# Patient Record
Sex: Female | Born: 1995 | Race: Black or African American | Hispanic: No | Marital: Single | Smoking: Never smoker
Health system: Southern US, Community
[De-identification: ages and names within clinical notes are randomized; demographics above are authoritative.]

## PROBLEM LIST (undated history)

## (undated) DIAGNOSIS — G43909 Migraine, unspecified, not intractable, without status migrainosus: Secondary | ICD-10-CM

## (undated) DIAGNOSIS — J45909 Unspecified asthma, uncomplicated: Secondary | ICD-10-CM

---
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
## 2017-01-13 ENCOUNTER — Emergency Department (HOSPITAL_BASED_OUTPATIENT_CLINIC_OR_DEPARTMENT_OTHER)
Admission: EM | Admit: 2017-01-13 | Discharge: 2017-01-13 | Disposition: A | Discharge status: Home / Self Care | Payer: Medicaid Other | Attending: Emergency Medicine | Admitting: Emergency Medicine

## 2017-01-13 ENCOUNTER — Encounter (HOSPITAL_BASED_OUTPATIENT_CLINIC_OR_DEPARTMENT_OTHER): Payer: Self-pay | Admitting: *Deleted

## 2017-01-13 ENCOUNTER — Other Ambulatory Visit: Payer: Self-pay

## 2017-01-13 DIAGNOSIS — Z79899 Other long term (current) drug therapy: Secondary | ICD-10-CM | POA: Diagnosis not present

## 2017-01-13 DIAGNOSIS — J020 Streptococcal pharyngitis: Secondary | ICD-10-CM | POA: Diagnosis not present

## 2017-01-13 DIAGNOSIS — J45909 Unspecified asthma, uncomplicated: Secondary | ICD-10-CM | POA: Diagnosis not present

## 2017-01-13 DIAGNOSIS — J029 Acute pharyngitis, unspecified: Secondary | ICD-10-CM | POA: Diagnosis present

## 2017-01-13 LAB — RAPID STREP SCREEN (MED CTR MEBANE ONLY): STREPTOCOCCUS, GROUP A SCREEN (DIRECT): NEGATIVE

## 2017-01-13 MED ORDER — AMOXICILLIN 500 MG PO CAPS: 500.0000 mg | ORAL_CAPSULE | Freq: Two times a day (BID) | ORAL | 0 refills | Status: AC

## 2017-01-13 MED FILL — AMOXICILLIN 500 MG CAPSULE: 500 | 7 days supply | Qty: 14 | Fill #0

## 2017-01-13 NOTE — Discharge Instructions (Signed)
You can take Tylenol or Ibuprofen as directed for pain. You can alternate Tylenol and Ibuprofen every 4 hours. If you take Tylenol at 1pm, then you can take Ibuprofen at 5pm. Then you can take Tylenol again at 9pm.   Take antibiotics as directed. Please take all of your antibiotics until finished.  Make sure you are drinking and staying hydrated.   Return to the Emergency Department for any worsening pain, fever, difficulty swallowing, difficulty breathing, or any other worsening or concerning symptoms.

## 2017-01-13 NOTE — ED Provider Notes (Signed)
[facility] EMERGENCY DEPARTMENT Provider Note   CSN: [id number] Arrival date & time: 01/13/17  1248     History   Chief Complaint Chief Complaint  Patient presents with  . Sore Throat    HPI Sarah Buckley is a 21 y.o. female who sent for evaluation of sore throat times 2 days.  Patient reports that she is not taking her medications for the symptoms.  The patient reports that she is able to swallow but reports worsening pain with swallowing.  Patient states that she is able to tolerate her secretions.  Has not had any fever, cough, difficulty breathing, wheezing, chest pain.  The history is provided by the patient.    Past Medical History:  Diagnosis Date  . Asthma   . Migraine     There are no active problems to display for this patient.   History reviewed. No pertinent surgical history.  OB History    No data available       Home Medications    Prior to Admission medications   Medication Sig Start Date End Date Taking? Authorizing Provider  ALBUTEROL IN Inhale into the lungs.   Yes [provider]  amoxicillin (AMOXIL) 500 MG capsule Take 1 capsule (500 mg total) by mouth 2 (two) times daily for 7 days. 01/13/17 01/20/17  Maxwell CaulLayden, Lindsey A, PA-C  esomeprazole (NEXIUM) 40 MG capsule Take 1 capsule (40 mg total) by mouth daily. 12/06/16 12/20/16  Caccavale, Sophia, PA-C  ipratropium (ATROVENT) 0.06 % nasal spray Place 2 sprays into both nostrils 3 (three) times daily. 06/21/13   Leona Singletonhekkekandam, Maria T, MD  ondansetron (ZOFRAN) 4 MG tablet Take 1 tablet (4 mg total) by mouth every 8 (eight) hours as needed for nausea or vomiting. 12/06/16   Caccavale, Sophia, PA-C    Family History No family history on file.  Social History Social History   Tobacco Use  . Smoking status: Never Smoker  . Smokeless tobacco: Never Used  Substance Use Topics  . Alcohol use: No  . Drug use: No     Allergies   Patient has no known allergies.   Review of  Systems Review of Systems  Constitutional: Negative for fever.  HENT: Positive for sore throat. Negative for drooling and trouble swallowing.   Respiratory: Negative for cough.      Physical Exam Updated Vital Signs BP 96/64   Pulse 81   Temp 98.5 F (36.9 C) (Oral)   Resp 18   Ht 4\' 11"  (1.499 m)   Wt 46.7 kg (103 lb)   LMP 12/23/2016   SpO2 100%   BMI 20.80 kg/m   Physical Exam  Constitutional: She appears well-developed and well-nourished.  HENT:  Head: Normocephalic and atraumatic.  Mouth/Throat: Uvula is midline. No trismus in the jaw. Oropharyngeal exudate, posterior oropharyngeal edema and posterior oropharyngeal erythema present. Tonsillar exudate.  Posterior oropharynx is erythematous, edematous with evidence of exudates.  Bilateral tonsils have exudates.  No evidence of peritonsillar abscess.  Uvula is midline.  No trismus.  Eyes: Conjunctivae and EOM are normal. Right eye exhibits no discharge. Left eye exhibits no discharge. No scleral icterus.  Pulmonary/Chest: Effort normal.  No evidence of respiratory distress. Able to speak in full sentences without difficulty.  Neurological: She is alert.  Skin: Skin is warm and dry.  Psychiatric: She has a normal mood and affect. Her speech is normal and behavior is normal.  Nursing note and vitals reviewed.    ED Treatments / Results  Labs (all labs ordered are listed, but only abnormal results are displayed) Labs Reviewed  RAPID STREP SCREEN (NOT AT [facility])  CULTURE, GROUP A STREP [facility])    EKG  EKG Interpretation None       Radiology No results found.  Procedures Procedures (including critical care time)  Medications Ordered in ED Medications - No data to display   Initial Impression / Assessment and Plan / ED Course  I have reviewed the triage vital signs and the nursing notes.  Pertinent labs & imaging results that were available during my care of the patient were reviewed by me and considered in  my medical decision making (see chart for details).     21 year old female who presents today for evaluation of 2 days of sore throat.  Has not been taking any medications for the pain.  Denies any fever, cough, chest pain, difficulty breathing.  She is able to tolerate her secretions. Patient is afebrile, non-toxic appearing, sitting comfortably on examination table. Vital signs reviewed and stable.  Exam shows posterior oropharynx is edematous, erythematous with presence of exudates on bilateral tonsils.  Consider pharyngitis versus URI versus postnasal drip.  History/physical exam and on concerning for peritonsillar abscess or Ludwig angina.  Rapid strep ordered at triage.  Rapid strep is negative.  Findings on physical exam, we will plan to treat.  Patient with no known drug allergies.  Conservative therapies discussed. Patient had ample opportunity for questions and discussion. All patient's questions were answered with full understanding. Strict return precautions discussed. Patient expresses understanding and agreement to plan.    Final Clinical Impressions(s) / ED Diagnoses   Final diagnoses:  Strep pharyngitis    ED Discharge Orders        Ordered    amoxicillin (AMOXIL) 500 MG capsule  2 times daily     01/13/17 1334       Maxwell CaulLayden, Lindsey A, PA-C 01/13/17 1642    Melene PlanFloyd, Dan, DO 01/14/17 0710

## 2017-01-13 NOTE — ED Triage Notes (Signed)
Sore throat x 2 days

## 2017-01-16 LAB — CULTURE, GROUP A STREP (THRC)

## 2017-08-10 ENCOUNTER — Encounter (HOSPITAL_BASED_OUTPATIENT_CLINIC_OR_DEPARTMENT_OTHER): Payer: Self-pay | Admitting: *Deleted

## 2017-08-10 ENCOUNTER — Emergency Department (HOSPITAL_BASED_OUTPATIENT_CLINIC_OR_DEPARTMENT_OTHER)
Admission: EM | Admit: 2017-08-10 | Discharge: 2017-08-10 | Disposition: A | Discharge status: Home / Self Care | Payer: Medicaid Other | Attending: Emergency Medicine | Admitting: Emergency Medicine

## 2017-08-10 ENCOUNTER — Other Ambulatory Visit: Payer: Self-pay

## 2017-08-10 DIAGNOSIS — Z79899 Other long term (current) drug therapy: Secondary | ICD-10-CM | POA: Diagnosis not present

## 2017-08-10 DIAGNOSIS — R319 Hematuria, unspecified: Secondary | ICD-10-CM | POA: Diagnosis present

## 2017-08-10 DIAGNOSIS — N3091 Cystitis, unspecified with hematuria: Secondary | ICD-10-CM | POA: Insufficient documentation

## 2017-08-10 DIAGNOSIS — N3090 Cystitis, unspecified without hematuria: Secondary | ICD-10-CM

## 2017-08-10 DIAGNOSIS — J45909 Unspecified asthma, uncomplicated: Secondary | ICD-10-CM | POA: Insufficient documentation

## 2017-08-10 LAB — PREGNANCY, URINE: Preg Test, Ur: NEGATIVE

## 2017-08-10 LAB — URINALYSIS, ROUTINE W REFLEX MICROSCOPIC

## 2017-08-10 LAB — URINALYSIS, MICROSCOPIC (REFLEX): RBC / HPF: >50 RBC/hpf (ref 0–5)

## 2017-08-10 MED ORDER — IBUPROFEN 400 MG PO TABS
600.0000 mg | ORAL_TABLET | Freq: Once | ORAL | Status: AC
  Administered 2017-08-10: 600 mg via ORAL
  Filled 2017-08-10: qty 1

## 2017-08-10 MED ORDER — OXYCODONE-ACETAMINOPHEN 5-325 MG PO TABS
1.0000 | ORAL_TABLET | Freq: Once | ORAL | Status: AC
  Administered 2017-08-10: 1 via ORAL
  Filled 2017-08-10: qty 1

## 2017-08-10 MED ORDER — PHENAZOPYRIDINE HCL 200 MG PO TABS: 200.0000 mg | ORAL_TABLET | Freq: Three times a day (TID) | ORAL | 0 refills | Status: DC

## 2017-08-10 MED ORDER — SULFAMETHOXAZOLE-TRIMETHOPRIM 800-160 MG PO TABS
1.0000 | ORAL_TABLET | Freq: Once | ORAL | Status: AC
  Administered 2017-08-10: 1 via ORAL
  Filled 2017-08-10: qty 1

## 2017-08-10 MED ORDER — SULFAMETHOXAZOLE-TRIMETHOPRIM 800-160 MG PO TABS: 1.0000 | ORAL_TABLET | Freq: Two times a day (BID) | ORAL | 0 refills | Status: AC

## 2017-08-10 NOTE — ED Triage Notes (Addendum)
Vaginal bleeding x 2 hours. States she is on Depo Injections. Last injection was 6 weeks ago. States she only can see blood after she urinates. Unsure if the bleeding is in her urine or coming from her vagina.

## 2017-08-12 LAB — URINE CULTURE: Culture: 60000 — AB

## 2017-08-13 ENCOUNTER — Telehealth: Payer: Self-pay | Admitting: *Deleted

## 2017-08-13 NOTE — Telephone Encounter (Signed)
Post ED Visit - Positive Culture Follow-up  Culture report reviewed by antimicrobial stewardship pharmacist:  []  Enzo BiNathan Batchelder, Pharm.D. []  Celedonio MiyamotoJeremy Frens, Pharm.D., BCPS AQ-ID []  Garvin FilaMike Maccia, Pharm.D., BCPS []  Georgina PillionElizabeth Martin, [address] BCPS []  HooverMinh Pham, [address]Ph[address]D., BCPS, AAHIVP []  Estella HuskMichelle Turner, Pharm.D., BCPS, AAHIVP []  Lysle Pearlachel Rumbarger, PharmD, BCPS []  Phillips Climeshuy Dang, PharmD, BCPS []  Agapito GamesAlison Masters, PharmD, BCPS []  Verlan FriendsErin Deja, PharmD Pola CornBen Mancheril, PharmD  Positive urine culture Treated with Sulfamethoxazole-Trimethoprim, organism sensitive to the same and no further patient follow-up is required at this time.  Virl AxeRobertson, Morgen Linebaugh [facility] 08/13/2017, 10:39 AM

## 2017-08-17 NOTE — ED Provider Notes (Signed)
[facility] EMERGENCY DEPARTMENT Provider Note   CSN: [id number] Arrival date & time: 08/10/17  2137     History   Chief Complaint Chief Complaint  Patient presents with  . Hematuria    HPI Sarah Buckley is a 22 y.o. female.  HPI   21yF with abdominal pain and hematuria. Onset earlier today. Noticed a lot of blood when she urinated. Some suprapubic pain which is significantly worse when she tries to void. Urgency. No vaginal bleeding or discharge. No fever. Doesn't think she is pregnant.   Past Medical History:  Diagnosis Date  . Asthma   . Migraine     There are no active problems to display for this patient.   History reviewed. No pertinent surgical history.   OB History   None      Home Medications    Prior to Admission medications   Medication Sig Start Date End Date Taking? Authorizing Provider  ALBUTEROL IN Inhale into the lungs.    [provider]  esomeprazole (NEXIUM) 40 MG capsule Take 1 capsule (40 mg total) by mouth daily. 12/06/16 12/20/16  Caccavale, Sophia, PA-C  ipratropium (ATROVENT) 0.06 % nasal spray Place 2 sprays into both nostrils 3 (three) times daily. 06/21/13   Leona Singleton, MD  ondansetron (ZOFRAN) 4 MG tablet Take 1 tablet (4 mg total) by mouth every 8 (eight) hours as needed for nausea or vomiting. 12/06/16   Caccavale, Sophia, PA-C  phenazopyridine (PYRIDIUM) 200 MG tablet Take 1 tablet (200 mg total) by mouth 3 (three) times daily. 08/10/17   Raeford Razor, MD    Family History No family history on file.  Social History Social History   Tobacco Use  . Smoking status: Never Smoker  . Smokeless tobacco: Never Used  Substance Use Topics  . Alcohol use: No  . Drug use: No     Allergies   Patient has no known allergies.   Review of Systems Review of Systems  All systems reviewed and negative, other than as noted in HPI.  Physical Exam Updated Vital Signs BP 114/72   Pulse 97   Temp 98.4 F  (36.9 C) (Oral)   Resp 20   Ht 4\' 11"  (1.499 m)   Wt 47.6 kg (105 lb)   LMP 08/10/2017   SpO2 100%   BMI 21.21 kg/m   Physical Exam  Constitutional: She appears well-developed and well-nourished. No distress.  HENT:  Head: Normocephalic and atraumatic.  Eyes: Conjunctivae are normal. Right eye exhibits no discharge. Left eye exhibits no discharge.  Neck: Neck supple.  Cardiovascular: Normal rate, regular rhythm and normal heart sounds. Exam reveals no gallop and no friction rub.  No murmur heard. Pulmonary/Chest: Effort normal and breath sounds normal. No respiratory distress.  Abdominal: Soft. She exhibits no distension. There is tenderness.  Mild suprapubic tenderness w/o rebound or guarding  Musculoskeletal: She exhibits no edema or tenderness.  Neurological: She is alert.  Skin: Skin is warm and dry.  Psychiatric: She has a normal mood and affect. Her behavior is normal. Thought content normal.  Nursing note and vitals reviewed.    ED Treatments / Results  Labs (all labs ordered are listed, but only abnormal results are displayed) Labs Reviewed  URINE CULTURE - Abnormal; Notable for the following components:      Result Value   Culture   (*)    Value: 60,000 COLONIES/mL GROUP B STREP(S.AGALACTIAE)ISOLATED TESTING AGAINST S. AGALACTIAE NOT ROUTINELY PERFORMED DUE TO PREDICTABILITY OF AMP/PEN/VAN  SUSCEPTIBILITY. Performed at [facility], [address] [place], [place] [id number]    All other components within normal limits  URINALYSIS, ROUTINE W REFLEX MICROSCOPIC - Abnormal; Notable for the following components:   Color, Urine RED (*)    APPearance TURBID (*)    Glucose, UA   (*)    Value: TEST NOT REPORTED DUE TO COLOR INTERFERENCE OF URINE PIGMENT   Hgb urine dipstick   (*)    Value: TEST NOT REPORTED DUE TO COLOR INTERFERENCE OF URINE PIGMENT   Bilirubin Urine   (*)    Value: TEST NOT REPORTED DUE TO COLOR INTERFERENCE OF URINE PIGMENT   Ketones, ur    (*)    Value: TEST NOT REPORTED DUE TO COLOR INTERFERENCE OF URINE PIGMENT   Protein, ur   (*)    Value: TEST NOT REPORTED DUE TO COLOR INTERFERENCE OF URINE PIGMENT   Nitrite   (*)    Value: TEST NOT REPORTED DUE TO COLOR INTERFERENCE OF URINE PIGMENT   Leukocytes, UA   (*)    Value: TEST NOT REPORTED DUE TO COLOR INTERFERENCE OF URINE PIGMENT   All other components within normal limits  URINALYSIS, MICROSCOPIC (REFLEX) - Abnormal; Notable for the following components:   Bacteria, UA FEW (*)    All other components within normal limits  PREGNANCY, URINE    EKG None  Radiology No results found.  Procedures Procedures (including critical care time)  Medications Ordered in ED Medications  ibuprofen (ADVIL,MOTRIN) tablet 600 mg (600 mg Oral Given 08/10/17 2227)  oxyCODONE-acetaminophen (PERCOCET/ROXICET) 5-325 MG per tablet 1 tablet (1 tablet Oral Given 08/10/17 2227)  sulfamethoxazole-trimethoprim (BACTRIM DS,SEPTRA DS) 800-160 MG per tablet 1 tablet (1 tablet Oral Given 08/10/17 2235)     Initial Impression / Assessment and Plan / ED Course  I have reviewed the triage vital signs and the nursing notes.  Pertinent labs & imaging results that were available during my care of the patient were reviewed by me and considered in my medical decision making (see chart for details).     Likely hemorrhagic cystitis. Hard to interpret UA with degree of blood. Will send culture and treat based off symptoms.   Final Clinical Impressions(s) / ED Diagnoses   Final diagnoses:  Cystitis    ED Discharge Orders        Ordered    sulfamethoxazole-trimethoprim (BACTRIM DS,SEPTRA DS) 800-160 MG tablet  2 times daily     08/10/17 2230    phenazopyridine (PYRIDIUM) 200 MG tablet  3 times daily     08/10/17 2230       Raeford RazorKohut, [place], MD 08/17/17 [phone]

## 2018-01-05 ENCOUNTER — Emergency Department (HOSPITAL_BASED_OUTPATIENT_CLINIC_OR_DEPARTMENT_OTHER): Payer: Medicaid Other

## 2018-01-05 ENCOUNTER — Emergency Department (HOSPITAL_BASED_OUTPATIENT_CLINIC_OR_DEPARTMENT_OTHER)
Admission: EM | Admit: 2018-01-05 | Discharge: 2018-01-06 | Disposition: A | Discharge status: Home / Self Care | Payer: Medicaid Other | Attending: Emergency Medicine | Admitting: Emergency Medicine

## 2018-01-05 ENCOUNTER — Other Ambulatory Visit: Payer: Self-pay

## 2018-01-05 ENCOUNTER — Encounter (HOSPITAL_BASED_OUTPATIENT_CLINIC_OR_DEPARTMENT_OTHER): Payer: Self-pay | Admitting: Emergency Medicine

## 2018-01-05 DIAGNOSIS — Y93I9 Activity, other involving external motion: Secondary | ICD-10-CM | POA: Diagnosis not present

## 2018-01-05 DIAGNOSIS — S3992XA Unspecified injury of lower back, initial encounter: Secondary | ICD-10-CM | POA: Diagnosis not present

## 2018-01-05 DIAGNOSIS — Y92410 Unspecified street and highway as the place of occurrence of the external cause: Secondary | ICD-10-CM | POA: Diagnosis not present

## 2018-01-05 DIAGNOSIS — Y998 Other external cause status: Secondary | ICD-10-CM | POA: Insufficient documentation

## 2018-01-05 DIAGNOSIS — S199XXA Unspecified injury of neck, initial encounter: Secondary | ICD-10-CM | POA: Insufficient documentation

## 2018-01-05 DIAGNOSIS — M542 Cervicalgia: Secondary | ICD-10-CM

## 2018-01-05 MED ORDER — ACETAMINOPHEN 500 MG PO TABS
1000.0000 mg | ORAL_TABLET | Freq: Once | ORAL | Status: AC
  Administered 2018-01-06: 1000 mg via ORAL
  Filled 2018-01-05: qty 2
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
## 2018-01-06 MED ORDER — NAPROXEN 375 MG PO TABS: 375.0000 mg | ORAL_TABLET | Freq: Two times a day (BID) | ORAL | 0 refills | Status: DC

## 2018-01-06 MED ORDER — METHOCARBAMOL 500 MG PO TABS: 500.0000 mg | ORAL_TABLET | Freq: Two times a day (BID) | ORAL | 0 refills | Status: DC
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
## 2018-03-08 ENCOUNTER — Emergency Department (HOSPITAL_BASED_OUTPATIENT_CLINIC_OR_DEPARTMENT_OTHER)
Admission: EM | Admit: 2018-03-08 | Discharge: 2018-03-08 | Disposition: A | Discharge status: Home / Self Care | Payer: Medicaid Other | Attending: Emergency Medicine | Admitting: Emergency Medicine

## 2018-03-08 ENCOUNTER — Other Ambulatory Visit: Payer: Self-pay

## 2018-03-08 ENCOUNTER — Encounter (HOSPITAL_BASED_OUTPATIENT_CLINIC_OR_DEPARTMENT_OTHER): Payer: Self-pay | Admitting: *Deleted

## 2018-03-08 DIAGNOSIS — Z79899 Other long term (current) drug therapy: Secondary | ICD-10-CM | POA: Insufficient documentation

## 2018-03-08 DIAGNOSIS — J45909 Unspecified asthma, uncomplicated: Secondary | ICD-10-CM | POA: Diagnosis not present

## 2018-03-08 DIAGNOSIS — Z202 Contact with and (suspected) exposure to infections with a predominantly sexual mode of transmission: Secondary | ICD-10-CM | POA: Diagnosis present

## 2018-03-08 LAB — URINALYSIS, ROUTINE W REFLEX MICROSCOPIC
BILIRUBIN URINE: NEGATIVE
GLUCOSE, UA: NEGATIVE mg/dL
KETONES UR: NEGATIVE mg/dL
NITRITE: NEGATIVE
PH: 6.5 (ref 5.0–8.0)
Protein, ur: NEGATIVE mg/dL
Specific Gravity, Urine: 1.015 (ref 1.005–1.030)

## 2018-03-08 LAB — URINALYSIS, MICROSCOPIC (REFLEX)

## 2018-03-08 LAB — PREGNANCY, URINE: Preg Test, Ur: NEGATIVE

## 2018-03-08 MED ORDER — LIDOCAINE HCL (PF) 1 % IJ SOLN
INTRAMUSCULAR | Status: AC
  Filled 2018-03-08: qty 5

## 2018-03-08 MED ORDER — CEFTRIAXONE SODIUM 250 MG IJ SOLR
250.0000 mg | Freq: Once | INTRAMUSCULAR | Status: AC
  Administered 2018-03-08: 250 mg via INTRAMUSCULAR
  Filled 2018-03-08: qty 250

## 2018-03-08 MED ORDER — AZITHROMYCIN 250 MG PO TABS
1000.0000 mg | ORAL_TABLET | Freq: Once | ORAL | Status: AC
  Administered 2018-03-08: 1000 mg via ORAL
  Filled 2018-03-08: qty 4

## 2018-03-08 NOTE — Discharge Instructions (Addendum)
You were treated for gonorrhea and chlamydia, results should be posted to MyChart by the end of the week.  Follow-up with your gynecologist as needed.

## 2018-03-08 NOTE — ED Triage Notes (Signed)
Her boyfriend cheated on her and he was exposed to chlamydia.

## 2018-03-08 NOTE — ED Provider Notes (Signed)
[facility] EMERGENCY DEPARTMENT Provider Note   CSN: [id number] Arrival date & time: 03/08/18  1702     History   Chief Complaint Chief Complaint  Patient presents with  . Exposure to STD    HPI Sarah Buckley is a 23 y.o. female.  23 y.o female with a PMH of Asthma, Migraine presents to the ED requesting STD testing. Patient reports her boyfriend testing positive for chlamydia and she would like to be treated. Patient states she has no vaginal discharge, vaginal bleeding, urinary symptoms.      Past Medical History:  Diagnosis Date  . Asthma   . Migraine     There are no active problems to display for this patient.   History reviewed. No pertinent surgical history.   OB History   No obstetric history on file.      Home Medications    Prior to Admission medications   Medication Sig Start Date End Date Taking? Authorizing Provider  ALBUTEROL IN Inhale into the lungs.   Yes [provider]  methocarbamol (ROBAXIN) 500 MG tablet Take 1 tablet (500 mg total) by mouth 2 (two) times daily. 01/06/18  Yes [place] Scrape, Alyssa B, PA-C  esomeprazole (NEXIUM) 40 MG capsule Take 1 capsule (40 mg total) by mouth daily. 12/06/16 12/20/16  Caccavale, Sophia, PA-C  ipratropium (ATROVENT) 0.06 % nasal spray Place 2 sprays into both nostrils 3 (three) times daily. 06/21/13   Leona Singleton, MD  naproxen (NAPROSYN) 375 MG tablet Take 1 tablet (375 mg total) by mouth 2 (two) times daily. 01/06/18   Aviva Kluver B, PA-C  ondansetron (ZOFRAN) 4 MG tablet Take 1 tablet (4 mg total) by mouth every 8 (eight) hours as needed for nausea or vomiting. 12/06/16   Caccavale, Sophia, PA-C  phenazopyridine (PYRIDIUM) 200 MG tablet Take 1 tablet (200 mg total) by mouth 3 (three) times daily. 08/10/17   Raeford Razor, MD    Family History No family history on file.  Social History Social History   Tobacco Use  . Smoking status: Never Smoker  . Smokeless tobacco: Never  Used  Substance Use Topics  . Alcohol use: No  . Drug use: No     Allergies   Patient has no known allergies.   Review of Systems Review of Systems  Constitutional: Negative for chills and fever.  HENT: Negative for ear pain and sore throat.   Eyes: Negative for pain and visual disturbance.  Respiratory: Negative for cough and shortness of breath.   Cardiovascular: Negative for chest pain and palpitations.  Gastrointestinal: Negative for abdominal pain and vomiting.  Genitourinary: Negative for dysuria and hematuria.  Musculoskeletal: Negative for arthralgias and back pain.  Skin: Negative for color change and rash.  Neurological: Negative for seizures and syncope.  All other systems reviewed and are negative.    Physical Exam Updated Vital Signs BP 113/72 (BP Location: Left Arm)   Pulse 75   Temp 98.7 F (37.1 C) (Oral)   Resp 20   Ht 4\' 11"  (1.499 m)   Wt 49.9 kg   SpO2 98%   BMI 22.22 kg/m   Physical Exam Vitals signs and nursing note reviewed.  Constitutional:      General: She is not in acute distress.    Appearance: She is well-developed.  HENT:     Head: Normocephalic and atraumatic.     Mouth/Throat:     Pharynx: No oropharyngeal exudate.  Eyes:     Pupils: Pupils are equal,  round, and reactive to light.  Neck:     Musculoskeletal: Normal range of motion.  Cardiovascular:     Rate and Rhythm: Regular rhythm.     Heart sounds: Normal heart sounds.  Pulmonary:     Effort: Pulmonary effort is normal. No respiratory distress.     Breath sounds: Normal breath sounds.  Abdominal:     General: Bowel sounds are normal. There is no distension.     Palpations: Abdomen is soft.     Tenderness: There is no abdominal tenderness.  Musculoskeletal:        General: No tenderness or deformity.     Right lower leg: No edema.     Left lower leg: No edema.  Skin:    General: Skin is warm and dry.  Neurological:     Mental Status: She is alert and oriented to  person, place, and time.      ED Treatments / Results  Labs (all labs ordered are listed, but only abnormal results are displayed) Labs Reviewed  URINALYSIS, ROUTINE W REFLEX MICROSCOPIC - Abnormal; Notable for the following components:      Result Value   Hgb urine dipstick MODERATE (*)    Leukocytes, UA SMALL (*)    All other components within normal limits  URINALYSIS, MICROSCOPIC (REFLEX) - Abnormal; Notable for the following components:   Bacteria, UA RARE (*)    All other components within normal limits  PREGNANCY, URINE  GC/CHLAMYDIA PROBE AMP ([place]) NOT AT [facility]    EKG None  Radiology No results found.  Procedures Procedures (including critical care time)  Medications Ordered in ED Medications  azithromycin (ZITHROMAX) tablet 1,000 mg (has no administration in time range)  cefTRIAXone (ROCEPHIN) injection 250 mg (has no administration in time range)     Initial Impression / Assessment and Plan / ED Course  I have reviewed the triage vital signs and the nursing notes.  Pertinent labs & imaging results that were available during my care of the patient were reviewed by me and considered in my medical decision making (see chart for details).    Shin with STD exposure by boyfriend, reports he was recently tested positive for chlamydia.  She denies any symptoms, self swab was performed as she declined pelvic exam due to no symptoms.  She denies any vaginal discharge, vaginal bleeding, dysuria or other symptoms at this time.  She is requesting treatment for gonorrhea and chlamydia.  At this time I have sent off a GC swab along with provided patient with Zithromax 1 g and Rocephin 250 to treat her STD exposure.  She denies any other symptoms at this time.  Patient is seen at [facility] [address] and reports she has an upcoming appointment for a annual exam and will be requesting further testing.  Denies any concern for herpes, other infections at this time.  Patient stable  during ED visit, patient stable for discharge at this time.  Final Clinical Impressions(s) / ED Diagnoses   Final diagnoses:  STD exposure    ED Discharge Orders    None       Claude Manges, Cordelia Poche 03/08/18 2053    Virgina Norfolk, DO 03/09/18 0100

## 2018-03-09 LAB — GC/CHLAMYDIA PROBE AMP (~~LOC~~) NOT AT ARMC
CHLAMYDIA, DNA PROBE: POSITIVE — AB
NEISSERIA GONORRHEA: NEGATIVE
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
## 2019-11-11 ENCOUNTER — Emergency Department (HOSPITAL_BASED_OUTPATIENT_CLINIC_OR_DEPARTMENT_OTHER)
Admission: EM | Admit: 2019-11-11 | Discharge: 2019-11-11 | Disposition: A | Discharge status: Home / Self Care | Payer: Medicaid Other | Attending: Emergency Medicine | Admitting: Emergency Medicine

## 2019-11-11 ENCOUNTER — Encounter (HOSPITAL_BASED_OUTPATIENT_CLINIC_OR_DEPARTMENT_OTHER): Payer: Self-pay | Admitting: *Deleted

## 2019-11-11 ENCOUNTER — Other Ambulatory Visit: Payer: Self-pay

## 2019-11-11 ENCOUNTER — Emergency Department (HOSPITAL_BASED_OUTPATIENT_CLINIC_OR_DEPARTMENT_OTHER): Payer: Medicaid Other

## 2019-11-11 DIAGNOSIS — S62522A Displaced fracture of distal phalanx of left thumb, initial encounter for closed fracture: Secondary | ICD-10-CM | POA: Insufficient documentation

## 2019-11-11 DIAGNOSIS — X501XXA Overexertion from prolonged static or awkward postures, initial encounter: Secondary | ICD-10-CM | POA: Diagnosis not present

## 2019-11-11 DIAGNOSIS — S6992XA Unspecified injury of left wrist, hand and finger(s), initial encounter: Secondary | ICD-10-CM | POA: Diagnosis present

## 2019-11-11 DIAGNOSIS — Y9301 Activity, walking, marching and hiking: Secondary | ICD-10-CM | POA: Insufficient documentation

## 2019-11-11 DIAGNOSIS — J45909 Unspecified asthma, uncomplicated: Secondary | ICD-10-CM | POA: Insufficient documentation

## 2019-11-11 NOTE — Discharge Instructions (Signed)
At this time there does not appear to be the presence of an emergent medical condition, however there is always the potential for conditions to change. Please read and follow the below instructions.  Please return to the Emergency Department immediately for any new or worsening symptoms. Please be sure to follow up with your Primary Care Provider within one week regarding your visit today; please call their office to schedule an appointment even if you are feeling better for a follow-up visit. Please call the hand specialist tomorrow morning Dr. Izora Ribas on your discharge paperwork for follow-up appointment regarding your thumb fracture.  Please use the thumb spica splint provided today to protect your thumb from further injury.  Use rest ice and elevation to help with pain and swelling.  Go to the nearest Emergency Department immediately if: You have fever or chills Your thumb feels numb, tingles, turns cold, or turns blue. You have redness or swelling that gets worse. You have severe pain. You have any new/concerning or worsening of symptoms  Please read the additional information packets attached to your discharge summary.  Do not take your medicine if  develop an itchy rash, swelling in your mouth or lips, or difficulty breathing; call 911 and seek immediate emergency medical attention if this occurs.  You may review your lab tests and imaging results in their entirety on your MyChart account.  Please discuss all results of fully with your primary care provider and other specialist at your follow-up visit.  Note: Portions of this text may have been transcribed using voice recognition software. Every effort was made to ensure accuracy; however, inadvertent computerized transcription errors may still be present.

## 2019-11-11 NOTE — ED Provider Notes (Signed)
[facility] EMERGENCY DEPARTMENT Provider Note   CSN: [id number] Arrival date & time: 11/11/19  1607     History Chief Complaint  Patient presents with  . Hand Injury    Sarah Buckley is a 24 y.o. female presents today for left thumb pain, patient reports around 3 PM today she was walking her dog when it pulled away from her, the leash was poked on the tip of her left thumb and pulled her thumb backwards towards her wrist.  She reports pain at the interphalangeal joint of the left thumb a moderate intensity throbbing pain constant worsened with movement at the joint improved with rest and time, pain does not radiate.  She denies fall, head injury, neck pain, back pain, chest pain, abdominal pain, numbness/tingling, weakness, skin break or any additional concerns.  HPI     Past Medical History:  Diagnosis Date  . Asthma   . Migraine     There are no problems to display for this patient.   History reviewed. No pertinent surgical history.   OB History   No obstetric history on file.     No family history on file.  Social History   Tobacco Use  . Smoking status: Never Smoker  . Smokeless tobacco: Never Used  Substance Use Topics  . Alcohol use: No  . Drug use: No    Home Medications Prior to Admission medications   Medication Sig Start Date End Date Taking? Authorizing Provider  ALBUTEROL IN Inhale into the lungs.   Yes [provider]  ipratropium (ATROVENT) 0.06 % nasal spray Place 2 sprays into both nostrils 3 (three) times daily. 06/21/13  Yes Leona Singleton, MD  esomeprazole (NEXIUM) 40 MG capsule Take 1 capsule (40 mg total) by mouth daily. 12/06/16 12/20/16  Caccavale, Sophia, PA-C  methocarbamol (ROBAXIN) 500 MG tablet Take 1 tablet (500 mg total) by mouth 2 (two) times daily. 01/06/18   Aviva Kluver B, PA-C  naproxen (NAPROSYN) 375 MG tablet Take 1 tablet (375 mg total) by mouth 2 (two) times daily. 01/06/18   Aviva Kluver B,  PA-C  ondansetron (ZOFRAN) 4 MG tablet Take 1 tablet (4 mg total) by mouth every 8 (eight) hours as needed for nausea or vomiting. 12/06/16   Caccavale, Sophia, PA-C  phenazopyridine (PYRIDIUM) 200 MG tablet Take 1 tablet (200 mg total) by mouth 3 (three) times daily. 08/10/17   Raeford Razor, MD    Allergies    Patient has no known allergies.  Review of Systems   Review of Systems  Constitutional: Negative for chills and fever.  Cardiovascular: Negative.  Negative for chest pain.  Gastrointestinal: Negative.  Negative for abdominal pain.  Musculoskeletal: Positive for arthralgias (Left arm). Negative for back pain and neck pain.  Skin: Negative for color change and wound.  Neurological: Negative for weakness, numbness and headaches.    Physical Exam Updated Vital Signs BP 111/79 (BP Location: Right Arm)   Pulse 76   Temp 99.5 F (37.5 C) (Oral)   Resp 18   Ht 4\' 11"  (1.499 m)   Wt 48.5 kg   SpO2 100%   BMI 21.61 kg/m   Physical Exam Constitutional:      General: She is not in acute distress.    Appearance: Normal appearance. She is well-developed. She is not ill-appearing or diaphoretic.  HENT:     Head: Normocephalic and atraumatic.  Eyes:     General: Vision grossly intact. Gaze aligned appropriately.  Pupils: Pupils are equal, round, and reactive to light.  Neck:     Trachea: Trachea and phonation normal.  Pulmonary:     Effort: Pulmonary effort is normal. No respiratory distress.  Abdominal:     General: There is no distension.     Palpations: Abdomen is soft.     Tenderness: There is no abdominal tenderness. There is no guarding or rebound.  Musculoskeletal:        General: Normal range of motion.     Cervical back: Normal range of motion.     Comments: Left hand: No gross deformities, skin intact. Fingers appear normal.  Tenderness to palpation over the interphalangeal joint of the left thumb.  No snuffbox tenderness to palpation.  No tenderness at the PIP  of the thumb.  Remainder of hand, wrist and fingers nontender. Finger adduction/abduction intact with 5/5 strength.  Thumb opposition intact.  Pain with movement at the interphalangeal joint of the thumb.  All other fingers and joints with full active and resisted ROM.  FDS/FDP intact. Grip 5/5 strength.  Radial artery 2+ with <2sec cap refill in all fingers.  Sensation intact to light-tough in median/ulnar/radial distributions.  Compartments soft.  Skin:    General: Skin is warm and dry.  Neurological:     Mental Status: She is alert.     GCS: GCS eye subscore is 4. GCS verbal subscore is 5. GCS motor subscore is 6.     Comments: Speech is clear and goal oriented, follows commands Major Cranial nerves without deficit, no facial droop Moves extremities without ataxia, coordination intact  Psychiatric:        Behavior: Behavior normal.     ED Results / Procedures / Treatments   Labs (all labs ordered are listed, but only abnormal results are displayed) Labs Reviewed - No data to display  EKG None  Radiology DG Finger Thumb Left  Result Date: 11/11/2019 CLINICAL DATA:  Left thumb pain after injury, dog leash wrapped around thumb while dog started quickly. Pain around the interphalangeal joint. EXAM: LEFT THUMB 2+V COMPARISON:  None. FINDINGS: Acute minimally displaced fracture involving the volar and radial articular surface of the thumb distal phalanx. The fracture abuts the interphalangeal joint. No other fracture. Alignment is otherwise normal. No focal soft tissue abnormality. IMPRESSION: Acute minimally displaced thumb distal phalanx fracture abutting the interphalangeal joint. Electronically Signed   By: Narda Rutherford M.D.   On: 11/11/2019 16:59    Procedures Procedures (including critical care time)  Medications Ordered in ED Medications - No data to display  ED Course  I have reviewed the triage vital signs and the nursing notes.  Pertinent labs & imaging results that  were available during my care of the patient were reviewed by me and considered in my medical decision making (see chart for details).    MDM Rules/Calculators/A&P                         Additional history obtained from: 1. Nursing notes from this visit. ----------------------------------- DG Left Thumb:  IMPRESSION:  Acute minimally displaced thumb distal phalanx fracture abutting the  interphalangeal joint.  - Patient neurovascular intact strong equal radial pulses, good capillary refill and sensation to all fingers.  She has limited range of motion at the interphalangeal joint of the left thumb but good range of motion at the PIP, no snuffbox tenderness.  Patient placed in spica splint to protect the thumb and referred to  hand specialist for follow-up.  No evidence of septic arthritis, cellulitis, DVT, compartment syndrome, neurovascular compromise or other emergent pathologies.  She denies any other injuries or pain today.  She states understanding to call Dr. Debby Bud office tomorrow morning to schedule a follow-up appointment for definitive care of her left thumb fracture.  No indication for reductions or further work-up at this time.  Discussed case and reviewed x-ray with Dr. Charm Barges who agrees with spica splint and discharged with hand follow-up.  At this time there does not appear to be any evidence of an acute emergency medical condition and the patient appears stable for discharge with appropriate outpatient follow up. Diagnosis was discussed with patient who verbalizes understanding of care plan and is agreeable to discharge. I have discussed return precautions with patient who verbalizes understanding. Patient encouraged to follow-up with their PCP and Dr. Izora Ribas. All questions answered.  Patient's case discussed with Dr. Charm Barges who agrees with spica and discharge with hand follow-up.   Note: Portions of this report may have been transcribed using voice recognition software. Every  effort was made to ensure accuracy; however, inadvertent computerized transcription errors may still be present. Final Clinical Impression(s) / ED Diagnoses Final diagnoses:  Closed displaced fracture of distal phalanx of left thumb, initial encounter    Rx / DC Orders ED Discharge Orders    None       Elizabeth Palau 11/11/19 2003    Terrilee Files, MD 11/12/19 1133

## 2019-11-11 NOTE — ED Triage Notes (Signed)
Left thumb injury. Her hand was caught in a dog leash while walking her boyfriends dog bending her thumb backward.

## 2020-12-03 ENCOUNTER — Encounter (HOSPITAL_BASED_OUTPATIENT_CLINIC_OR_DEPARTMENT_OTHER): Payer: Self-pay

## 2020-12-03 ENCOUNTER — Other Ambulatory Visit: Payer: Self-pay

## 2020-12-03 DIAGNOSIS — R109 Unspecified abdominal pain: Secondary | ICD-10-CM | POA: Insufficient documentation

## 2020-12-03 DIAGNOSIS — R197 Diarrhea, unspecified: Secondary | ICD-10-CM | POA: Diagnosis not present

## 2020-12-03 DIAGNOSIS — J45909 Unspecified asthma, uncomplicated: Secondary | ICD-10-CM | POA: Diagnosis not present

## 2020-12-03 LAB — COMPREHENSIVE METABOLIC PANEL
ALT: 12 U/L (ref 0–44)
AST: 16 U/L (ref 15–41)
Albumin: 4.3 g/dL (ref 3.5–5.0)
Alkaline Phosphatase: 46 U/L (ref 38–126)
Anion gap: 8 (ref 5–15)
BUN: 12 mg/dL (ref 6–20)
CO2: 23 mmol/L (ref 22–32)
Calcium: 9.1 mg/dL (ref 8.9–10.3)
Chloride: 105 mmol/L (ref 98–111)
Creatinine, Ser: 0.72 mg/dL (ref 0.44–1.00)
GFR, Estimated: >60 mL/min (ref >60)
Glucose, Bld: 94 mg/dL (ref 70–99)
Potassium: 4 mmol/L (ref 3.5–5.1)
Sodium: 136 mmol/L (ref 135–145)
Total Bilirubin: 0.3 mg/dL (ref 0.3–1.2)
Total Protein: 7.6 g/dL (ref 6.5–8.1)

## 2020-12-03 LAB — CBC
HCT: 40 % (ref 36.0–46.0)
Hemoglobin: 13.4 g/dL (ref 12.0–15.0)
MCH: 30.2 pg (ref 26.0–34.0)
MCHC: 33.5 g/dL (ref 30.0–36.0)
MCV: 90.3 fL (ref 80.0–100.0)
Platelets: 328 10*3/uL (ref 150–400)
RBC: 4.43 MIL/uL (ref 3.87–5.11)
RDW: 13.2 % (ref 11.5–15.5)
WBC: 5.2 10*3/uL (ref 4.0–10.5)
nRBC: 0 % (ref 0.0–0.2)

## 2020-12-03 LAB — LIPASE, BLOOD: Lipase: 28 U/L (ref 11–51)

## 2020-12-03 MED ORDER — ONDANSETRON 4 MG PO TBDP: 4.0000 mg | ORAL_TABLET | Freq: Once | ORAL | Status: AC

## 2020-12-03 MED ORDER — ONDANSETRON 4 MG PO TBDP
ORAL_TABLET | ORAL | Status: AC
  Administered 2020-12-03: 4 mg via ORAL
  Filled 2020-12-03: qty 1

## 2020-12-03 NOTE — ED Triage Notes (Signed)
Pt c/o diarrhea started 10/20-bloody stools and n/v started 10/22-NAD-steady gait

## 2020-12-04 ENCOUNTER — Emergency Department (HOSPITAL_BASED_OUTPATIENT_CLINIC_OR_DEPARTMENT_OTHER)
Admission: EM | Admit: 2020-12-04 | Discharge: 2020-12-04 | Disposition: A | Discharge status: Home / Self Care | Payer: Medicaid Other | Attending: Emergency Medicine | Admitting: Emergency Medicine

## 2020-12-04 DIAGNOSIS — R197 Diarrhea, unspecified: Secondary | ICD-10-CM

## 2020-12-04 LAB — URINALYSIS, MICROSCOPIC (REFLEX)

## 2020-12-04 LAB — URINALYSIS, ROUTINE W REFLEX MICROSCOPIC
Bilirubin Urine: NEGATIVE
Glucose, UA: NEGATIVE mg/dL
Ketones, ur: 15 mg/dL — AB
Leukocytes,Ua: NEGATIVE
Nitrite: NEGATIVE
Protein, ur: NEGATIVE mg/dL
Specific Gravity, Urine: 1.025 (ref 1.005–1.030)
pH: 6 (ref 5.0–8.0)

## 2020-12-04 LAB — PREGNANCY, URINE: Preg Test, Ur: NEGATIVE

## 2020-12-04 MED ORDER — SODIUM CHLORIDE 0.9 % IV BOLUS
1000.0000 mL | Freq: Once | INTRAVENOUS | Status: AC
  Administered 2020-12-04: 1000 mL via INTRAVENOUS

## 2020-12-04 MED ORDER — ONDANSETRON HCL 4 MG/2ML IJ SOLN
4.0000 mg | INTRAMUSCULAR | Status: AC
  Administered 2020-12-04: 4 mg via INTRAVENOUS
  Filled 2020-12-04: qty 2

## 2020-12-04 MED ORDER — ONDANSETRON 4 MG PO TBDP: 4.0000 mg | ORAL_TABLET | Freq: Three times a day (TID) | ORAL | 0 refills | Status: AC | PRN

## 2020-12-04 NOTE — ED Provider Notes (Signed)
[facility] EMERGENCY DEPARTMENT Provider Note   CSN: [id number] Arrival date & time: 12/03/20  1857     History Chief Complaint  Patient presents with   Diarrhea    Sarah Buckley is a 25 y.o. female.   Diarrhea  This patient is a 25 year old female with no significant prior medical history taking no daily medications and having no allergies to medications.  She works as a Production assistant, radio at [employer] and states that a customer vomited all over the table several nights ago, she was cleaning it up and unfortunately within 24 hours she also began to vomit and have diarrhea which is now been going on for about 2-1/2 days.  There is loose to watery stools, several times a day, there is cramping associated with this which resolves with a bowel movement and multiple episodes of nausea and vomiting.  She has no fevers or chills no recent travel, no medications of help prior to coming in today.  She was given ODT Zofran which has helped.  Past Medical History:  Diagnosis Date   Asthma    Migraine     There are no problems to display for this patient.   History reviewed. No pertinent surgical history.   OB History   No obstetric history on file.     No family history on file.  Social History   Tobacco Use   Smoking status: Never   Smokeless tobacco: Never  Vaping Use   Vaping Use: Never used  Substance Use Topics   Alcohol use: Yes    Comment: occ   Drug use: No    Home Medications Prior to Admission medications   Medication Sig Start Date End Date Taking? Authorizing Provider  medroxyPROGESTERone (DEPO-PROVERA) 150 MG/ML injection Inject into the muscle. 08/08/20  Yes [provider]  ondansetron (ZOFRAN ODT) 4 MG disintegrating tablet Take 1 tablet (4 mg total) by mouth every 8 (eight) hours as needed for nausea. 12/04/20  Yes Eber Hong, MD  ALBUTEROL IN Inhale into the lungs.    [provider]    Allergies    Patient has no known  allergies.  Review of Systems   Review of Systems  Gastrointestinal:  Positive for diarrhea.  All other systems reviewed and are negative.  Physical Exam Updated Vital Signs BP 105/74   Pulse 76   Temp 98.4 F (36.9 C) (Oral)   Resp 18   Ht 1.499 m (4\' 11" )   Wt 52.2 kg   LMP  (LMP Unknown)   SpO2 100%   BMI 23.23 kg/m   Physical Exam Vitals and nursing note reviewed.  Constitutional:      General: She is not in acute distress.    Appearance: She is well-developed.  HENT:     Head: Normocephalic and atraumatic.     Mouth/Throat:     Pharynx: No oropharyngeal exudate.  Eyes:     General: No scleral icterus.       Right eye: No discharge.        Left eye: No discharge.     Conjunctiva/sclera: Conjunctivae normal.     Pupils: Pupils are equal, round, and reactive to light.  Neck:     Thyroid: No thyromegaly.     Vascular: No JVD.  Cardiovascular:     Rate and Rhythm: Normal rate and regular rhythm.     Heart sounds: Normal heart sounds. No murmur heard.   No friction rub. No gallop.  Pulmonary:  Effort: Pulmonary effort is normal. No respiratory distress.     Breath sounds: Normal breath sounds. No wheezing or rales.  Abdominal:     General: Bowel sounds are normal. There is no distension.     Palpations: Abdomen is soft. There is no mass.     Tenderness: There is abdominal tenderness.     Comments: Bowel sounds are normal, minimal mid abdominal tenderness without any guarding, no other abdominal tenderness including right lower or right upper quadrant  Genitourinary:    Comments: No CVA tenderness Musculoskeletal:        General: No tenderness. Normal range of motion.     Cervical back: Normal range of motion and neck supple.  Lymphadenopathy:     Cervical: No cervical adenopathy.  Skin:    General: Skin is warm and dry.     Findings: No erythema or rash.  Neurological:     Mental Status: She is alert.     Coordination: Coordination normal.   Psychiatric:        Behavior: Behavior normal.    ED Results / Procedures / Treatments   Labs (all labs ordered are listed, but only abnormal results are displayed) Labs Reviewed  URINALYSIS, ROUTINE W REFLEX MICROSCOPIC - Abnormal; Notable for the following components:      Result Value   APPearance HAZY (*)    Hgb urine dipstick MODERATE (*)    Ketones, ur 15 (*)    All other components within normal limits  URINALYSIS, MICROSCOPIC (REFLEX) - Abnormal; Notable for the following components:   Bacteria, UA RARE (*)    All other components within normal limits  LIPASE, BLOOD  COMPREHENSIVE METABOLIC PANEL  CBC  PREGNANCY, URINE    EKG None  Radiology No results found.  Procedures Procedures   Medications Ordered in ED Medications  ondansetron (ZOFRAN-ODT) disintegrating tablet 4 mg (4 mg Oral Given 12/03/20 1952)  sodium chloride 0.9 % bolus 1,000 mL (1,000 mLs Intravenous New Bag/Given 12/04/20 0125)  ondansetron (ZOFRAN) injection 4 mg (4 mg Intravenous Given 12/04/20 0126)    ED Course  I have reviewed the triage vital signs and the nursing notes.  Pertinent labs & imaging results that were available during my care of the patient were reviewed by me and considered in my medical decision making (see chart for details).    MDM Rules/Calculators/A&P                           Vitals are normal, labs are unremarkable, the patient is well-appearing but likely has some element of dehydration.  Will give bolus as well as some more Zofran, anticipate the ability to discharge after that, sounds like this is either viral gastroenteritis or food poisoning.  IVF given, stable for d/c  Final Clinical Impression(s) / ED Diagnoses Final diagnoses:  Diarrhea, unspecified type    Rx / DC Orders ED Discharge Orders          Ordered    ondansetron (ZOFRAN ODT) 4 MG disintegrating tablet  Every 8 hours PRN        12/04/20 0219             Eber Hong,  MD 12/04/20 [phone]

## 2020-12-04 NOTE — Discharge Instructions (Addendum)
  Zofran every 6 hours as needed for nasuea  Thank you for letting us take care of you today!  Please obtain all of your results from medical records or have your doctors office obtain the results - share them with your doctor - you should be seen at your doctors office in the next 2 days. Call today to arrange your follow up. Take the medications as prescribed. Please review all of the medicines and only take them if you do not have an allergy to them. Please be aware that if you are taking birth control pills, taking other prescriptions, ESPECIALLY ANTIBIOTICS may make the birth control ineffective - if this is the case, either do not engage in sexual activity or use alternative methods of birth control such as condoms until you have finished the medicine and your family doctor says it is OK to restart them. If you are on a blood thinner such as COUMADIN, be aware that any other medicine that you take may cause the coumadin to either work too much, or not enough - you should have your coumadin level rechecked in next 7 days if this is the case.  ?  It is also a possibility that you have an allergic reaction to any of the medicines that you have been prescribed - Everybody reacts differently to medications and while MOST people have no trouble with most medicines, you may have a reaction such as nausea, vomiting, rash, swelling, shortness of breath. If this is the case, please stop taking the medicine immediately and contact your physician.   If you were given a medication in the ED such as percocet, vicodin, or morphine, be aware that these medicines are sedating and may change your ability to take care of yourself adequately for several hours after being given this medicines - you should not drive or take care of small children if you were given this medicine in the Emergency Department or if you have been prescribed these types of medicines. ?   You should return to the ER IMMEDIATELY if you develop  severe or worsening symptoms.

## 2020-12-14 ENCOUNTER — Other Ambulatory Visit: Payer: Self-pay

## 2020-12-14 ENCOUNTER — Emergency Department (HOSPITAL_BASED_OUTPATIENT_CLINIC_OR_DEPARTMENT_OTHER)
Admission: EM | Admit: 2020-12-14 | Discharge: 2020-12-15 | Disposition: A | Discharge status: Home / Self Care | Payer: Medicaid Other | Attending: Emergency Medicine | Admitting: Emergency Medicine

## 2020-12-14 ENCOUNTER — Encounter (HOSPITAL_BASED_OUTPATIENT_CLINIC_OR_DEPARTMENT_OTHER): Payer: Self-pay

## 2020-12-14 DIAGNOSIS — J101 Influenza due to other identified influenza virus with other respiratory manifestations: Secondary | ICD-10-CM | POA: Insufficient documentation

## 2020-12-14 DIAGNOSIS — R059 Cough, unspecified: Secondary | ICD-10-CM | POA: Diagnosis present

## 2020-12-14 DIAGNOSIS — R1084 Generalized abdominal pain: Secondary | ICD-10-CM | POA: Diagnosis not present

## 2020-12-14 DIAGNOSIS — J45909 Unspecified asthma, uncomplicated: Secondary | ICD-10-CM | POA: Insufficient documentation

## 2020-12-14 DIAGNOSIS — Z20822 Contact with and (suspected) exposure to covid-19: Secondary | ICD-10-CM | POA: Diagnosis not present

## 2020-12-14 DIAGNOSIS — R112 Nausea with vomiting, unspecified: Secondary | ICD-10-CM | POA: Diagnosis not present

## 2020-12-14 DIAGNOSIS — R197 Diarrhea, unspecified: Secondary | ICD-10-CM | POA: Diagnosis not present

## 2020-12-14 LAB — CBC WITH DIFFERENTIAL/PLATELET
Abs Immature Granulocytes: 0.01 10*3/uL (ref 0.00–0.07)
Basophils Absolute: 0 10*3/uL (ref 0.0–0.1)
Basophils Relative: 0 %
Eosinophils Absolute: 0 10*3/uL (ref 0.0–0.5)
Eosinophils Relative: 0 %
HCT: 37.8 % (ref 36.0–46.0)
Hemoglobin: 12.6 g/dL (ref 12.0–15.0)
Immature Granulocytes: 0 %
Lymphocytes Relative: 16 %
Lymphs Abs: 0.5 10*3/uL — ABNORMAL LOW (ref 0.7–4.0)
MCH: 30 pg (ref 26.0–34.0)
MCHC: 33.3 g/dL (ref 30.0–36.0)
MCV: 90 fL (ref 80.0–100.0)
Monocytes Absolute: 0.4 10*3/uL (ref 0.1–1.0)
Monocytes Relative: 11 %
Neutro Abs: 2.4 10*3/uL (ref 1.7–7.7)
Neutrophils Relative %: 73 %
Platelets: 218 10*3/uL (ref 150–400)
RBC: 4.2 MIL/uL (ref 3.87–5.11)
RDW: 13.2 % (ref 11.5–15.5)
WBC: 3.3 10*3/uL — ABNORMAL LOW (ref 4.0–10.5)
nRBC: 0 % (ref 0.0–0.2)

## 2020-12-14 LAB — URINALYSIS, MICROSCOPIC (REFLEX)

## 2020-12-14 LAB — COMPREHENSIVE METABOLIC PANEL
ALT: 25 U/L (ref 0–44)
AST: 29 U/L (ref 15–41)
Albumin: 4.2 g/dL (ref 3.5–5.0)
Alkaline Phosphatase: 42 U/L (ref 38–126)
Anion gap: 12 (ref 5–15)
BUN: 11 mg/dL (ref 6–20)
CO2: 21 mmol/L — ABNORMAL LOW (ref 22–32)
Calcium: 8.7 mg/dL — ABNORMAL LOW (ref 8.9–10.3)
Chloride: 104 mmol/L (ref 98–111)
Creatinine, Ser: 0.77 mg/dL (ref 0.44–1.00)
GFR, Estimated: >60 mL/min (ref >60)
Glucose, Bld: 92 mg/dL (ref 70–99)
Potassium: 3 mmol/L — ABNORMAL LOW (ref 3.5–5.1)
Sodium: 137 mmol/L (ref 135–145)
Total Bilirubin: 0.4 mg/dL (ref 0.3–1.2)
Total Protein: 7.5 g/dL (ref 6.5–8.1)

## 2020-12-14 LAB — PREGNANCY, URINE: Preg Test, Ur: NEGATIVE

## 2020-12-14 LAB — URINALYSIS, ROUTINE W REFLEX MICROSCOPIC
Bilirubin Urine: NEGATIVE
Glucose, UA: NEGATIVE mg/dL
Ketones, ur: 80 mg/dL — AB
Leukocytes,Ua: NEGATIVE
Nitrite: NEGATIVE
Protein, ur: 30 mg/dL — AB
Specific Gravity, Urine: >=1.03 (ref 1.005–1.030)
pH: 6 (ref 5.0–8.0)

## 2020-12-14 LAB — RESP PANEL BY RT-PCR (FLU A&B, COVID) ARPGX2
Influenza A by PCR: POSITIVE — AB
Influenza B by PCR: NEGATIVE
SARS Coronavirus 2 by RT PCR: NEGATIVE

## 2020-12-14 LAB — LIPASE, BLOOD: Lipase: 29 U/L (ref 11–51)

## 2020-12-14 MED ORDER — SODIUM CHLORIDE 0.9 % IV BOLUS
1000.0000 mL | Freq: Once | INTRAVENOUS | Status: AC
  Administered 2020-12-14: 1000 mL via INTRAVENOUS

## 2020-12-14 MED ORDER — ONDANSETRON HCL 4 MG/2ML IJ SOLN
4.0000 mg | Freq: Once | INTRAMUSCULAR | Status: AC
  Administered 2020-12-14: 4 mg via INTRAVENOUS
  Filled 2020-12-14: qty 2

## 2020-12-14 MED ORDER — KETOROLAC TROMETHAMINE 30 MG/ML IJ SOLN
30.0000 mg | Freq: Once | INTRAMUSCULAR | Status: AC
  Administered 2020-12-14: 30 mg via INTRAVENOUS
  Filled 2020-12-14: qty 1

## 2020-12-14 NOTE — ED Triage Notes (Addendum)
Pt c/o abd pain n/v/d-states she was seen here for same "a week ago"-pt hyperventilating-c/o numbness to hands and feet-encouraged to take slow deep breaths-slow steady gait

## 2020-12-14 NOTE — ED Provider Notes (Signed)
[facility] EMERGENCY DEPARTMENT Provider Note   CSN: [id number] Arrival date & time: 12/14/20  1908     History Chief Complaint  Patient presents with   Abdominal Pain    Sarah Buckley is a 25 y.o. female.  Patient with no past medical history presents today with complaint of cough, congestion, nausea, vomiting, diarrhea, abdominal pain.  States that she was seen on 10/25 for her abdominal pain nausea vomiting and diarrhea, was suspected to have viral gastroenteritis, given Zofran and discharged.  She has continued to have symptoms which she feels like have worsened.  She has persistent diarrhea and vomiting and has been unable to tolerate Zofran she was discharged with.  She denies any urinary symptoms, is not on her menstrual cycle.  Denies any abnormal vaginal discharge or bleeding.  She does state that she is not eating and drinking well persistent nausea and vomiting.  Additionally, on Monday she began to develop upper respiratory symptoms such as cough productive of green mucus with subjective fevers.  Denies any chest pain or shortness of breath.  Of note, upon reevaluation, patient states that she was unaware of proper administration of Zofran as she was taking it like a pill instead of letting it dissolve.  The history is provided by the patient. No language interpreter was used.  Abdominal Pain Associated symptoms: cough, diarrhea, fever, nausea and vomiting   Associated symptoms: no chest pain, no chills, no dysuria and no shortness of breath       Past Medical History:  Diagnosis Date   Asthma    Migraine     There are no problems to display for this patient.   History reviewed. No pertinent surgical history.   OB History   No obstetric history on file.     No family history on file.  Social History   Tobacco Use   Smoking status: Never   Smokeless tobacco: Never  Vaping Use   Vaping Use: Never used  Substance Use Topics   Alcohol use: Yes     Comment: occ   Drug use: No    Home Medications Prior to Admission medications   Medication Sig Start Date End Date Taking? Authorizing Provider  ALBUTEROL IN Inhale into the lungs.    [provider]  medroxyPROGESTERone (DEPO-PROVERA) 150 MG/ML injection Inject into the muscle. 08/08/20   [provider]  ondansetron (ZOFRAN ODT) 4 MG disintegrating tablet Take 1 tablet (4 mg total) by mouth every 8 (eight) hours as needed for nausea. 12/04/20   Eber Hong, MD    Allergies    Patient has no known allergies.  Review of Systems   Review of Systems  Constitutional:  Positive for fever. Negative for chills.  HENT:  Positive for congestion, rhinorrhea and sinus pain.   Respiratory:  Positive for cough. Negative for shortness of breath, wheezing and stridor.   Cardiovascular:  Negative for chest pain, palpitations and leg swelling.  Gastrointestinal:  Positive for abdominal pain, diarrhea, nausea and vomiting.  Genitourinary:  Negative for difficulty urinating and dysuria.  Musculoskeletal:  Negative for neck pain and neck stiffness.  Skin:  Negative for pallor.  Neurological:  Negative for dizziness, tremors, seizures, syncope, facial asymmetry, speech difficulty, weakness, light-headedness, numbness and headaches.  Psychiatric/Behavioral:  Negative for confusion and decreased concentration.   All other systems reviewed and are negative.  Physical Exam Updated Vital Signs BP 111/71   Pulse 95   Temp 98.8 F (37.1 C)   Resp  20   Ht 4\' 11"  (1.499 m)   Wt 52.2 kg   LMP  (LMP Unknown)   SpO2 98%   BMI 23.23 kg/m   Physical Exam Vitals and nursing note reviewed.  Constitutional:      General: She is not in acute distress.    Appearance: She is well-developed and normal weight. She is not ill-appearing, toxic-appearing or diaphoretic.     Comments: Patient well-appearing, sitting in bed in no apparent distress.  HENT:     Head: Normocephalic and  atraumatic.  Cardiovascular:     Rate and Rhythm: Normal rate and regular rhythm.     Heart sounds: Normal heart sounds.  Pulmonary:     Effort: Pulmonary effort is normal.     Breath sounds: Normal breath sounds.  Abdominal:     General: Abdomen is flat. Bowel sounds are normal. There is no distension. There are no signs of injury.     Palpations: Abdomen is soft.     Tenderness: There is generalized abdominal tenderness. There is no right CVA tenderness, left CVA tenderness, guarding or rebound. Negative signs include Murphy's sign, Rovsing's sign, McBurney's sign, psoas sign and obturator sign.  Skin:    General: Skin is warm and dry.  Neurological:     General: No focal deficit present.     Mental Status: She is alert.  Psychiatric:        Mood and Affect: Mood normal.        Behavior: Behavior normal.    ED Results / Procedures / Treatments   Labs (all labs ordered are listed, but only abnormal results are displayed) Labs Reviewed  CBC WITH DIFFERENTIAL/PLATELET  COMPREHENSIVE METABOLIC PANEL  LIPASE, BLOOD  URINALYSIS, ROUTINE W REFLEX MICROSCOPIC  PREGNANCY, URINE    EKG None  Radiology No results found.  Procedures Procedures   Medications Ordered in ED Medications  sodium chloride 0.9 % bolus 1,000 mL (has no administration in time range)  ondansetron (ZOFRAN) injection 4 mg (has no administration in time range)  ketorolac (TORADOL) 30 MG/ML injection 30 mg (has no administration in time range)    ED Course  I have reviewed the triage vital signs and the nursing notes.  Pertinent labs & imaging results that were available during my care of the patient were reviewed by me and considered in my medical decision making (see chart for details).    MDM Rules/Calculators/A&P                         Patient positive for influenza A in the apartment department today.  She is awake, alert, nontoxic-appearing, and in no acute distress.  Lung sounds are clear to  auscultation in all fields, therefore no imaging imaging is indicated for work-up of URI symptoms.  Feel that symptoms in this regard can be managed with supportive care including plenty of fluids, and Tylenol ibuprofen for fevers.  Patient is amenable with this plan.  Additionally, patient with overlying nausea, vomiting, and diarrhea that she states is persisted over the last 2 weeks.  Patient was given Zofran on her last ER visit, however patient states she has not been taking it due to not tolerating it because of vomiting.  However, it was discovered that patient was not letting the tablet dissolve and was instead taking it like a pill.  Patient is nontoxic, nonseptic appearing, in no apparent distress.  Upon reevaluation of patient after Zofran, fluids, and pain management,  patient symptoms have completely resolved.  She has not had any episodes of vomiting or diarrhea since entering the department several hours ago.  She is able to tolerate fluid challenge drinking several cups of water without difficulty.  Upon reexamination, patient is no longer tender in her abdomen.  Additionally, patient does not have leukocytosis or anemia.  Some hypokalemia noted which is consistent with persistent nausea and vomiting.  This has been repleted in the department this evening.  We will also send patient home with additional potassium supplementation.  Patient's pain and other symptoms adequately managed in emergency department.  Fluid bolus given.  Labs and vitals reviewed.  Additional imaging indicated at this time.  Patient does not meet the SIRS or Sepsis criteria.  On repeat exam patient does not have a surgical abdomin and there are no peritoneal signs.  No indication of appendicitis, bowel obstruction, bowel perforation, cholecystitis, diverticulitis, PID or ectopic pregnancy.  Patient discharged home with symptomatic treatment and given strict instructions for follow-up with their primary care physician.  I have  also discussed reasons to return immediately to the ER.  Patient expresses understanding and agrees with plan.  Patient discharged in stable condition.  Findings and plan of care discussed with supervising physician Dr. Deretha Emory who is in agreement.    Final Clinical Impression(s) / ED Diagnoses Final diagnoses:  Influenza A  Nausea vomiting and diarrhea    Rx / DC Orders ED Discharge Orders          Ordered    potassium chloride (KLOR-CON) 10 MEQ tablet   Once        12/15/20 0021          An After Visit Summary was printed and given to the patient.    Vear Clock 12/15/20 Young Berry, MD 12/28/20 [phone]

## 2020-12-14 NOTE — ED Notes (Signed)
Pt hyperventilating in w/c, brought to bed 6. A/ox4, per pt and companion, pt was seen here 1 week ago with ABD and diarrhea, still having these symptoms along with n/v, sore throat, SOB, CP. CP increases with inspiration. Pt states her throat pain started first. LS clear bilaterally. ABD soft nontender. Denies GU symptoms. Pt also c/o numbness/tingling to hands legs and mouth. Pt breathing coached for hypervent.

## 2020-12-15 MED ORDER — POTASSIUM CHLORIDE ER 10 MEQ PO TBCR: 40.0000 meq | EXTENDED_RELEASE_TABLET | Freq: Once | ORAL | 0 refills | Status: AC

## 2020-12-15 MED ORDER — POTASSIUM CHLORIDE CRYS ER 20 MEQ PO TBCR
40.0000 meq | EXTENDED_RELEASE_TABLET | Freq: Once | ORAL | Status: AC
  Administered 2020-12-15: 40 meq via ORAL
  Filled 2020-12-15: qty 2

## 2020-12-15 NOTE — ED Notes (Signed)
Pt given water per request

## 2020-12-15 NOTE — ED Notes (Signed)
Pt given blanket.

## 2020-12-15 NOTE — Discharge Instructions (Addendum)
You were diagnosed with influenza A in the emergency department this evening.  This is a viral illness and therefore antibiotics or other interventions are not indicated.  Management of this is supportive care with plenty of fluids and rest.   Additionally you were seen for continued management of your of nausea, vomiting, and diarrhea.  The Zofran you were prescribed is a tablet that dissolves in your mouth.  Please take this as prescribed over the next few days as needed for the symptoms.  Follow-up with your primary care in the next few days for continued symptom management  Return if development of new or worsening symptoms.
